# Patient Record
Sex: Female | Born: 2000
Health system: Southern US, Community
[De-identification: ages and names within clinical notes are randomized; demographics above are authoritative.]

## PROBLEM LIST (undated history)

## (undated) DIAGNOSIS — K219 Gastro-esophageal reflux disease without esophagitis: Secondary | ICD-10-CM

## (undated) DIAGNOSIS — F909 Attention-deficit hyperactivity disorder, unspecified type: Secondary | ICD-10-CM

---
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
## 2024-01-06 ENCOUNTER — Encounter: Payer: Self-pay | Admitting: Emergency Medicine

## 2024-01-06 ENCOUNTER — Ambulatory Visit
Admission: EM | Admit: 2024-01-06 | Discharge: 2024-01-06 | Disposition: A | Discharge status: Home / Self Care | Attending: Family Medicine | Admitting: Family Medicine

## 2024-01-06 DIAGNOSIS — S161XXA Strain of muscle, fascia and tendon at neck level, initial encounter: Secondary | ICD-10-CM | POA: Diagnosis not present

## 2024-01-06 MED ORDER — NAPROXEN 500 MG PO TABS: 500.0000 mg | ORAL_TABLET | Freq: Two times a day (BID) | ORAL | 0 refills | Status: AC

## 2024-01-06 MED ORDER — CYCLOBENZAPRINE HCL 5 MG PO TABS: 5.0000 mg | ORAL_TABLET | Freq: Three times a day (TID) | ORAL | 0 refills | Status: AC | PRN
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
## 2024-01-07 ENCOUNTER — Ambulatory Visit: Payer: Self-pay
